# Patient Record
Sex: Male | Born: 1950 | Race: Black or African American | Hispanic: No | Marital: Married | Smoking: Current every day smoker
Health system: Southern US, Community
[De-identification: ages and names within clinical notes are randomized; demographics above are authoritative.]

---

## 2006-07-31 ENCOUNTER — Ambulatory Visit (HOSPITAL_COMMUNITY): Admission: RE | Admit: 2006-07-31 | Discharge: 2006-07-31 | Payer: Self-pay | Admitting: *Deleted

## 2017-04-12 ENCOUNTER — Emergency Department (HOSPITAL_COMMUNITY)
Admission: EM | Admit: 2017-04-12 | Discharge: 2017-04-12 | Disposition: A | Discharge status: Home / Self Care | Payer: 59 | Attending: Emergency Medicine | Admitting: Emergency Medicine

## 2017-04-12 ENCOUNTER — Other Ambulatory Visit: Payer: Self-pay

## 2017-04-12 ENCOUNTER — Emergency Department (HOSPITAL_COMMUNITY): Payer: 59

## 2017-04-12 ENCOUNTER — Encounter (HOSPITAL_COMMUNITY): Payer: Self-pay

## 2017-04-12 DIAGNOSIS — R0981 Nasal congestion: Secondary | ICD-10-CM | POA: Diagnosis present

## 2017-04-12 DIAGNOSIS — F17290 Nicotine dependence, other tobacco product, uncomplicated: Secondary | ICD-10-CM | POA: Diagnosis not present

## 2017-04-12 DIAGNOSIS — J069 Acute upper respiratory infection, unspecified: Secondary | ICD-10-CM | POA: Insufficient documentation

## 2017-04-12 MED ORDER — GUAIFENESIN-DM 100-10 MG/5ML PO SYRP: 5.0000 mL | ORAL_SOLUTION | Freq: Three times a day (TID) | ORAL | 0 refills | Status: AC | PRN

## 2017-04-12 NOTE — ED Provider Notes (Signed)
[facility]-EMERGENCY DEPT Provider Note   CSN: [id number] Arrival date & time: 04/12/17  0843     History   Chief Complaint Chief Complaint  Patient presents with  . Nasal Congestion  . Cough    HPI Eric Marsh is a 67 y.o. male.  HPI Patient presents with a cough and feeling bad.  Nasal congestion.  No real sore throat.  Decreased appetite.  Had a for around the last 4 days.  Has had fevers and chills.  Slight sputum production.  He does smoke cigars and a pipe.  Slight dull chest pain.  No nausea vomiting diarrhea.  Does have decreased appetite.  States his wife has similar symptoms. History reviewed. No pertinent past medical history.  There are no active problems to display for this patient.   History reviewed. No pertinent surgical history.     Home Medications    Prior to Admission medications   Medication Sig Start Date End Date Taking? Authorizing Provider  guaiFENesin-dextromethorphan (ROBITUSSIN DM) 100-10 MG/5ML syrup Take 5 mLs by mouth 3 (three) times daily as needed for cough. 04/12/17   Benjiman Core, MD    Family History History reviewed. No pertinent family history.  Social History Social History   Tobacco Use  . Smoking status: Current Every Day Smoker    Types: Pipe, Cigars  . Smokeless tobacco: Never Used  Substance Use Topics  . Alcohol use: Not on file  . Drug use: Not on file     Allergies   Penicillins   Review of Systems Review of Systems  Constitutional: Positive for appetite change. Negative for fever.  HENT: Positive for congestion. Negative for sore throat.   Respiratory: Positive for cough.   Cardiovascular: Positive for chest pain.  Gastrointestinal: Negative for abdominal pain.  Genitourinary: Negative for flank pain.  Musculoskeletal: Positive for myalgias.  Skin: Negative for rash.  Neurological: Negative for weakness.  Hematological: Negative for adenopathy.  Psychiatric/Behavioral:  Negative for confusion.     Physical Exam Updated Vital Signs BP 130/68 (BP Location: Right Arm)   Pulse 81   Temp 98.1 F (36.7 C) (Oral)   Ht 6' (1.829 m)   Wt 93 kg (205 lb)   SpO2 97%   BMI 27.80 kg/m   Physical Exam  Constitutional: He appears well-developed.  HENT:  Head: Normocephalic.  Eyes: Pupils are equal, round, and reactive to light.  Neck: Neck supple.  Cardiovascular: Normal rate.  Pulmonary/Chest:  Scattered rales and left raise.  Abdominal: He exhibits no distension.  Musculoskeletal: He exhibits no edema.  Neurological: He is alert.  Skin: Skin is warm. Capillary refill takes less than 2 seconds.  Psychiatric: He has a normal mood and affect.     ED Treatments / Results  Labs (all labs ordered are listed, but only abnormal results are displayed) Labs Reviewed - No data to display  EKG  EKG Interpretation None       Radiology Dg Chest 2 View  Result Date: 04/12/2017 CLINICAL DATA:  New onset cough and congestion since Friday. EXAM: CHEST  2 VIEW COMPARISON:  Chest x-ray report dated June 24, 2014. FINDINGS: The cardiomediastinal silhouette is normal in size. Normal pulmonary vascularity. No focal consolidation, pleural effusion, or pneumothorax. No acute osseous abnormality. IMPRESSION: No active cardiopulmonary disease. Electronically Signed   By: Obie Dredge M.D.   On: 04/12/2017 10:01    Procedures Procedures (including critical care time)  Medications Ordered in ED Medications - No data  to display   Initial Impression / Assessment and Plan / ED Course  I have reviewed the triage vital signs and the nursing notes.  Pertinent labs & imaging results that were available during my care of the patient were reviewed by me and considered in my medical decision making (see chart for details).     Patient with shortness of breath and cough.  Has had some chills.  Likely URI.  No pneumonia on x-ray.  Well-appearing.  Discharge home with  symptomatic treatment.  Final Clinical Impressions(s) / ED Diagnoses   Final diagnoses:  Upper respiratory tract infection, unspecified type    ED Discharge Orders        Ordered    guaiFENesin-dextromethorphan (ROBITUSSIN DM) 100-10 MG/5ML syrup  3 times daily PRN     04/12/17 1058       Benjiman CorePickering, Maico Mulvehill, MD 04/12/17 1100

## 2017-04-12 NOTE — ED Triage Notes (Signed)
EMS reports from work flu like symptoms since Friday, congestion and cough, weakness and muscle cramps and loss of appetite. Has taken mucinex and advil at home with no resolve  BP 130/68 HR 81 Resp 16 sp02 97 RA  18ga LAC  400ml NS enroute

## 2019-02-09 IMAGING — CR DG CHEST 2V
2 series · 2 of 2 positions shown · non-contrast
Comparison: Chest x-ray report dated June 24, 2014.

CLINICAL DATA: New onset cough and congestion since [REDACTED].

EXAM:
CHEST  2 VIEW

[w chest pa]
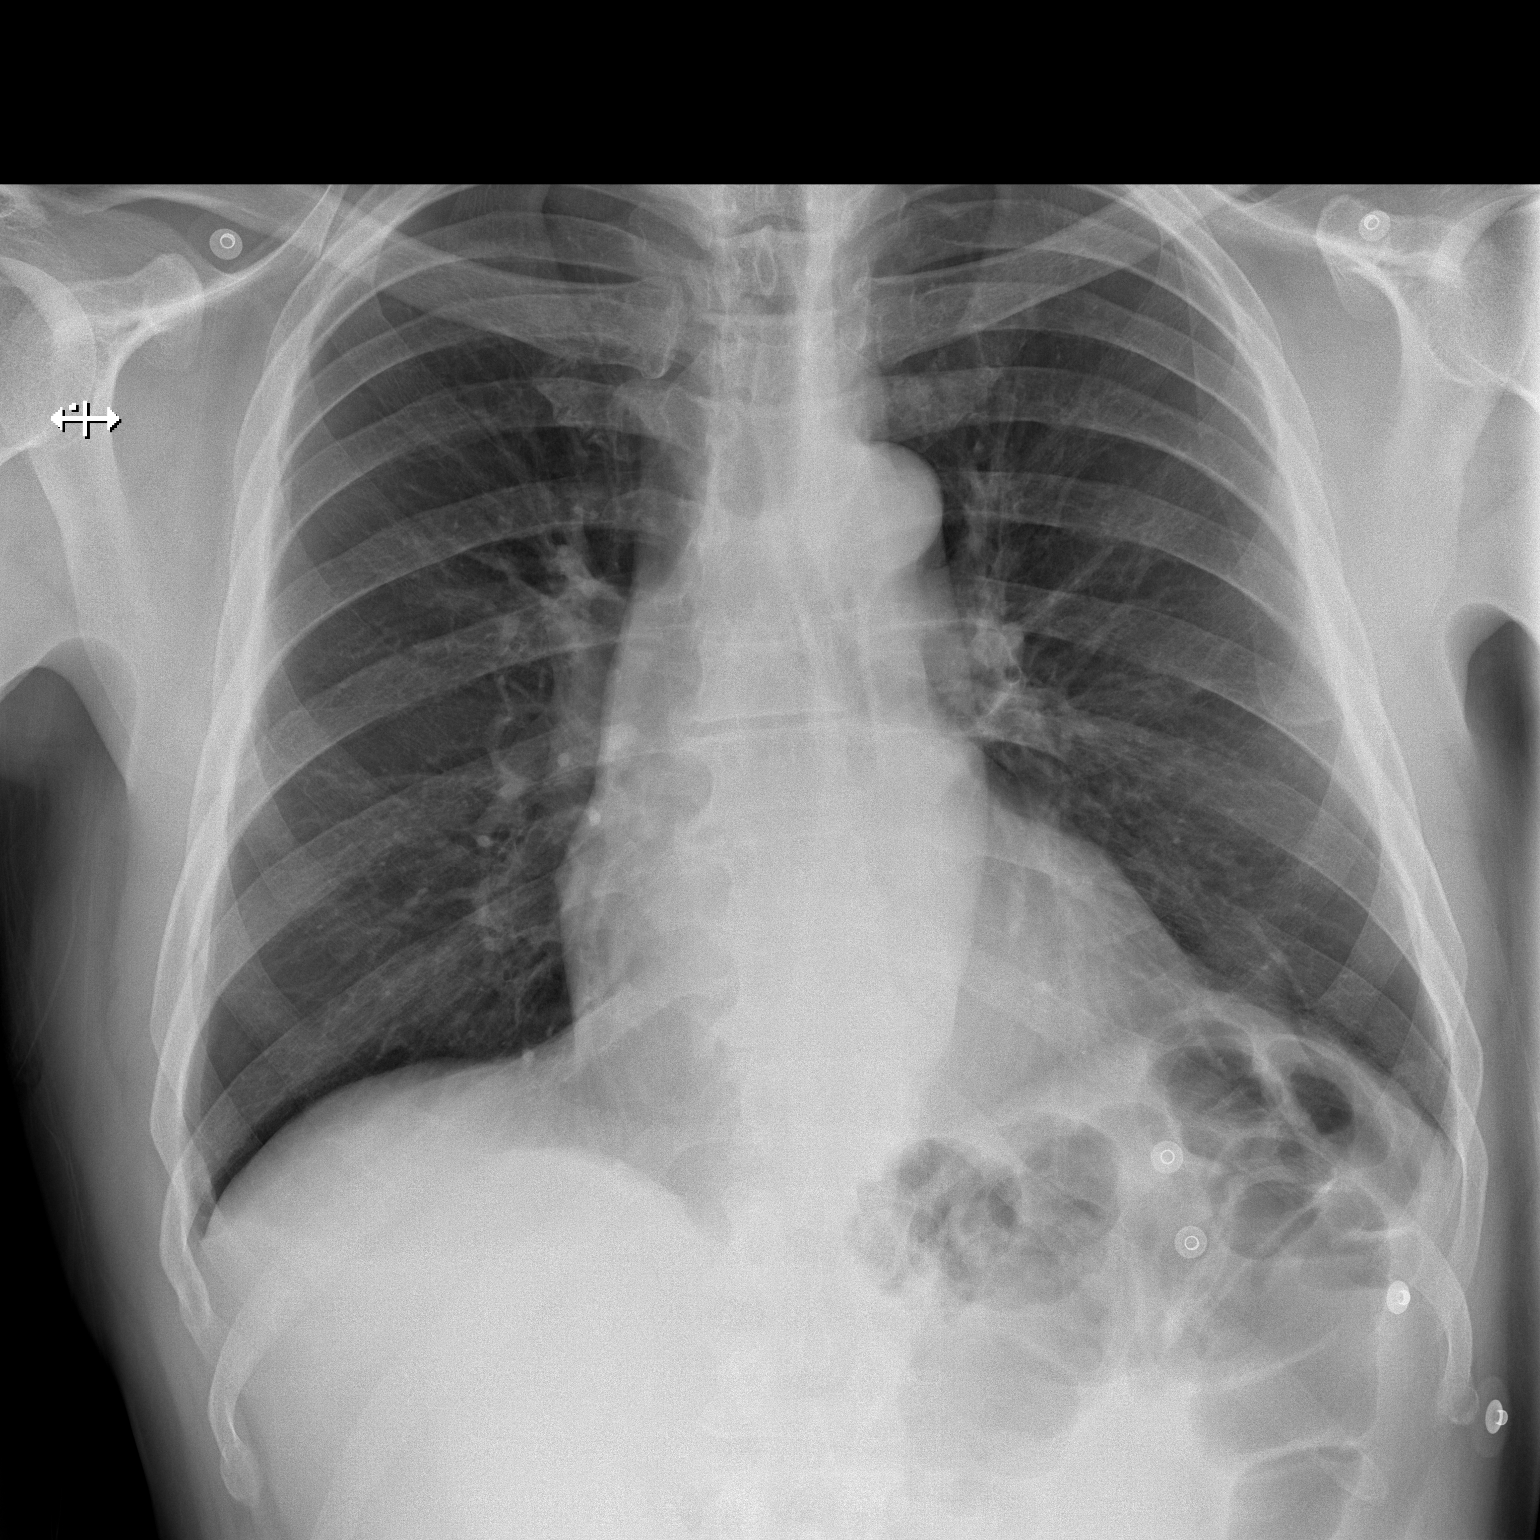

[w chest lat]
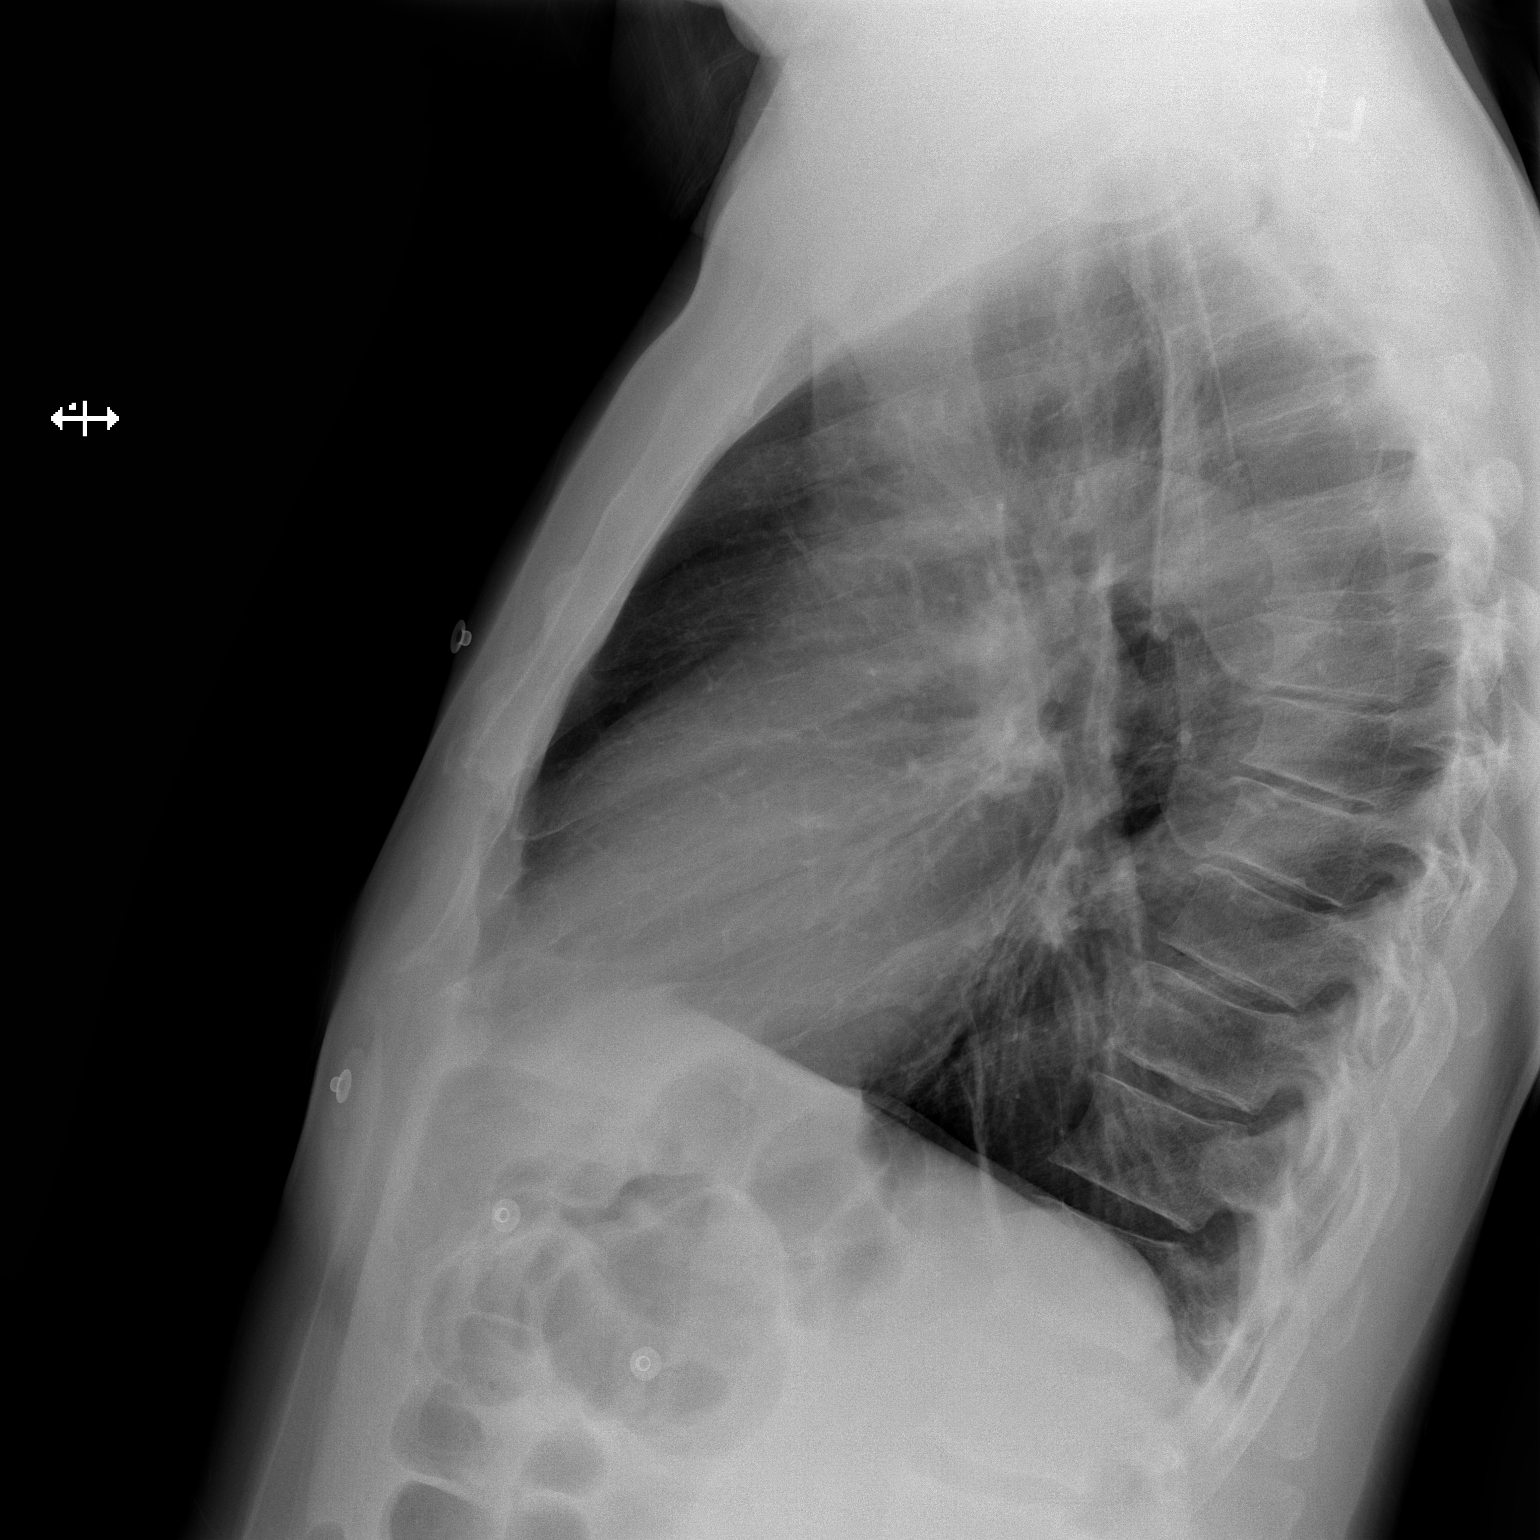

[2 of 2 positions shown; findings below may reference images not displayed]

FINDINGS: The cardiomediastinal silhouette is normal in size. Normal pulmonary
vascularity. No focal consolidation, pleural effusion, or
pneumothorax. No acute osseous abnormality.
IMPRESSION: No active cardiopulmonary disease.
